# Patient Record
Sex: Female | Born: 2000 | Race: White | Hispanic: No | Marital: Single | Smoking: Never smoker
Health system: Southern US, Community
[De-identification: ages and names within clinical notes are randomized; demographics above are authoritative.]

## PROBLEM LIST (undated history)

## (undated) DIAGNOSIS — Z9109 Other allergy status, other than to drugs and biological substances: Secondary | ICD-10-CM

---
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
## 2019-09-20 ENCOUNTER — Ambulatory Visit: Payer: Self-pay | Attending: Internal Medicine

## 2019-09-20 DIAGNOSIS — Z23 Encounter for immunization: Secondary | ICD-10-CM

## 2019-09-20 NOTE — Progress Notes (Signed)
   Covid-19 Vaccination Clinic  Name:  Angel Wright    MRN: [id number] DOB: 09/05/2000  09/20/2019  [name] was observed post Covid-19 immunization for 15 minutes without incident. She was provided with Vaccine Information Sheet and instruction to access the V-Safe system.   [name] was instructed to call 911 with any severe reactions post vaccine: Marland Kitchen Difficulty breathing  . Swelling of face and throat  . A fast heartbeat  . A bad rash all over body  . Dizziness and weakness   Immunizations Administered    Name Date Dose VIS Date Route   Pfizer COVID-19 Vaccine 09/20/2019 11:42 AM 0.3 mL 05/02/2018 Intramuscular   Manufacturer: [employer], [employer]   Lot: [id number]   NDC: [address]-1

## 2019-10-11 ENCOUNTER — Ambulatory Visit: Attending: Internal Medicine

## 2019-10-11 DIAGNOSIS — Z23 Encounter for immunization: Secondary | ICD-10-CM

## 2019-10-11 NOTE — Progress Notes (Signed)
   Covid-19 Vaccination Clinic  Name:  Naiyah Klostermann    MRN: [id number] DOB: 11-21-2000  10/11/2019  Ms. Wass was observed post Covid-19 immunization for 15 minutes without incident. She was provided with Vaccine Information Sheet and instruction to access the V-Safe system.   Ms. Rehmann was instructed to call 911 with any severe reactions post vaccine: Marland Kitchen Difficulty breathing  . Swelling of face and throat  . A fast heartbeat  . A bad rash all over body  . Dizziness and weakness   Immunizations Administered    Name Date Dose VIS Date Route   Pfizer COVID-19 Vaccine 10/11/2019 11:06 AM 0.3 mL 05/02/2018 Intramuscular   Manufacturer: [employer], [employer]   Lot: [id number]   NDC: [address]-1

## 2020-03-12 ENCOUNTER — Other Ambulatory Visit
